# Patient Record
Sex: Male | Born: 1995 | Race: Black or African American | Hispanic: No | Marital: Single | Smoking: Current every day smoker
Health system: Southern US, Community
[De-identification: ages and names within clinical notes are randomized; demographics above are authoritative.]

## PROBLEM LIST (undated history)

## (undated) DIAGNOSIS — H6690 Otitis media, unspecified, unspecified ear: Secondary | ICD-10-CM

---
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
## 2014-07-03 ENCOUNTER — Encounter (HOSPITAL_COMMUNITY): Payer: Self-pay | Admitting: Emergency Medicine

## 2014-07-03 ENCOUNTER — Emergency Department (HOSPITAL_COMMUNITY)
Admission: EM | Admit: 2014-07-03 | Discharge: 2014-07-03 | Disposition: A | Discharge status: Home / Self Care | Payer: No Typology Code available for payment source | Attending: Emergency Medicine | Admitting: Emergency Medicine

## 2014-07-03 DIAGNOSIS — Y9389 Activity, other specified: Secondary | ICD-10-CM | POA: Insufficient documentation

## 2014-07-03 DIAGNOSIS — Z720 Tobacco use: Secondary | ICD-10-CM | POA: Insufficient documentation

## 2014-07-03 DIAGNOSIS — Y998 Other external cause status: Secondary | ICD-10-CM | POA: Diagnosis not present

## 2014-07-03 DIAGNOSIS — S39012A Strain of muscle, fascia and tendon of lower back, initial encounter: Secondary | ICD-10-CM | POA: Diagnosis not present

## 2014-07-03 DIAGNOSIS — S29012A Strain of muscle and tendon of back wall of thorax, initial encounter: Secondary | ICD-10-CM | POA: Insufficient documentation

## 2014-07-03 DIAGNOSIS — S199XXA Unspecified injury of neck, initial encounter: Secondary | ICD-10-CM | POA: Diagnosis not present

## 2014-07-03 DIAGNOSIS — Z8669 Personal history of other diseases of the nervous system and sense organs: Secondary | ICD-10-CM | POA: Insufficient documentation

## 2014-07-03 DIAGNOSIS — Y92410 Unspecified street and highway as the place of occurrence of the external cause: Secondary | ICD-10-CM | POA: Insufficient documentation

## 2014-07-03 DIAGNOSIS — S3992XA Unspecified injury of lower back, initial encounter: Secondary | ICD-10-CM | POA: Diagnosis present

## 2014-07-03 MED ORDER — NAPROXEN 500 MG PO TABS: 500.0000 mg | ORAL_TABLET | Freq: Two times a day (BID) | ORAL | Status: AC

## 2014-07-03 MED ORDER — METHOCARBAMOL 500 MG PO TABS: 500.0000 mg | ORAL_TABLET | Freq: Two times a day (BID) | ORAL | Status: AC
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
